# Patient Record
Sex: Female | Born: 1965
Health system: Southern US, Community
[De-identification: ages and names within clinical notes are randomized; demographics above are authoritative.]

## PROBLEM LIST (undated history)

## (undated) DIAGNOSIS — A749 Chlamydial infection, unspecified: Secondary | ICD-10-CM

## (undated) DIAGNOSIS — D649 Anemia, unspecified: Secondary | ICD-10-CM

## (undated) DIAGNOSIS — T4145XA Adverse effect of unspecified anesthetic, initial encounter: Secondary | ICD-10-CM

## (undated) DIAGNOSIS — R87619 Unspecified abnormal cytological findings in specimens from cervix uteri: Secondary | ICD-10-CM

## (undated) DIAGNOSIS — A549 Gonococcal infection, unspecified: Secondary | ICD-10-CM

## (undated) DIAGNOSIS — T8859XA Other complications of anesthesia, initial encounter: Secondary | ICD-10-CM

## (undated) DIAGNOSIS — IMO0002 Reserved for concepts with insufficient information to code with codable children: Secondary | ICD-10-CM

## (undated) DIAGNOSIS — O0900 Supervision of pregnancy with history of infertility, unspecified trimester: Secondary | ICD-10-CM

## (undated) HISTORY — DX: Reserved for concepts with insufficient information to code with codable children: IMO0002

## (undated) HISTORY — DX: Gonococcal infection, unspecified: A54.9

## (undated) HISTORY — DX: Anemia, unspecified: D64.9

## (undated) HISTORY — DX: Unspecified abnormal cytological findings in specimens from cervix uteri: R87.619

## (undated) HISTORY — DX: Adverse effect of unspecified anesthetic, initial encounter: T41.45XA

## (undated) HISTORY — DX: Chlamydial infection, unspecified: A74.9

## (undated) HISTORY — DX: Supervision of pregnancy with history of infertility, unspecified trimester: O09.00

## (undated) HISTORY — DX: Other complications of anesthesia, initial encounter: T88.59XA

---

## 1991-09-08 HISTORY — PX: KNEE SURGERY: SHX244

## 1996-09-07 DIAGNOSIS — A549 Gonococcal infection, unspecified: Secondary | ICD-10-CM

## 1996-09-07 DIAGNOSIS — A749 Chlamydial infection, unspecified: Secondary | ICD-10-CM

## 1996-09-07 HISTORY — DX: Chlamydial infection, unspecified: A74.9

## 1996-09-07 HISTORY — DX: Gonococcal infection, unspecified: A54.9

## 1998-01-29 ENCOUNTER — Other Ambulatory Visit: Admission: RE | Admit: 1998-01-29 | Discharge: 1998-01-29 | Payer: Self-pay | Admitting: Obstetrics and Gynecology

## 1998-02-04 ENCOUNTER — Other Ambulatory Visit: Admission: RE | Admit: 1998-02-04 | Discharge: 1998-02-04 | Payer: Self-pay | Admitting: Obstetrics and Gynecology

## 1999-03-27 ENCOUNTER — Other Ambulatory Visit: Admission: RE | Admit: 1999-03-27 | Discharge: 1999-03-27 | Payer: Self-pay | Admitting: *Deleted

## 1999-08-21 ENCOUNTER — Other Ambulatory Visit: Admission: RE | Admit: 1999-08-21 | Discharge: 1999-08-21 | Payer: Self-pay | Admitting: *Deleted

## 1999-09-10 ENCOUNTER — Emergency Department (HOSPITAL_COMMUNITY): Admission: EM | Admit: 1999-09-10 | Discharge: 1999-09-10 | Payer: Self-pay | Admitting: Emergency Medicine

## 2000-04-05 ENCOUNTER — Other Ambulatory Visit: Admission: RE | Admit: 2000-04-05 | Discharge: 2000-04-05 | Payer: Self-pay | Admitting: *Deleted

## 2000-04-30 ENCOUNTER — Encounter: Payer: Self-pay | Admitting: *Deleted

## 2000-04-30 ENCOUNTER — Ambulatory Visit (HOSPITAL_COMMUNITY): Admission: RE | Admit: 2000-04-30 | Discharge: 2000-04-30 | Payer: Self-pay | Admitting: *Deleted

## 2001-09-13 ENCOUNTER — Other Ambulatory Visit: Admission: RE | Admit: 2001-09-13 | Discharge: 2001-09-13 | Payer: Self-pay | Admitting: Obstetrics and Gynecology

## 2002-12-18 ENCOUNTER — Other Ambulatory Visit: Admission: RE | Admit: 2002-12-18 | Discharge: 2002-12-18 | Payer: Self-pay | Admitting: Obstetrics and Gynecology

## 2003-02-20 ENCOUNTER — Observation Stay (HOSPITAL_COMMUNITY): Admission: RE | Admit: 2003-02-20 | Discharge: 2003-02-21 | Payer: Self-pay | Admitting: Obstetrics and Gynecology

## 2004-01-08 ENCOUNTER — Other Ambulatory Visit: Admission: RE | Admit: 2004-01-08 | Discharge: 2004-01-08 | Payer: Self-pay | Admitting: Gynecology

## 2005-01-02 ENCOUNTER — Other Ambulatory Visit: Admission: RE | Admit: 2005-01-02 | Discharge: 2005-01-02 | Payer: Self-pay | Admitting: Gynecology

## 2005-12-16 ENCOUNTER — Other Ambulatory Visit: Admission: RE | Admit: 2005-12-16 | Discharge: 2005-12-16 | Payer: Self-pay | Admitting: Obstetrics and Gynecology

## 2006-02-19 ENCOUNTER — Inpatient Hospital Stay (HOSPITAL_COMMUNITY): Admission: AD | Admit: 2006-02-19 | Discharge: 2006-02-19 | Payer: Self-pay | Admitting: Obstetrics and Gynecology

## 2006-05-17 ENCOUNTER — Inpatient Hospital Stay (HOSPITAL_COMMUNITY): Admission: AD | Admit: 2006-05-17 | Discharge: 2006-05-17 | Payer: Self-pay | Admitting: Obstetrics and Gynecology

## 2006-06-08 ENCOUNTER — Ambulatory Visit: Payer: Self-pay | Admitting: Obstetrics and Gynecology

## 2006-06-17 ENCOUNTER — Inpatient Hospital Stay (HOSPITAL_COMMUNITY): Admission: AD | Admit: 2006-06-17 | Discharge: 2006-06-17 | Payer: Self-pay | Admitting: Obstetrics and Gynecology

## 2006-06-23 ENCOUNTER — Inpatient Hospital Stay (HOSPITAL_COMMUNITY): Admission: AD | Admit: 2006-06-23 | Discharge: 2006-06-26 | Payer: Self-pay | Admitting: Obstetrics and Gynecology

## 2011-03-08 ENCOUNTER — Inpatient Hospital Stay (HOSPITAL_COMMUNITY): Payer: BC Managed Care – PPO

## 2011-03-08 ENCOUNTER — Inpatient Hospital Stay (HOSPITAL_COMMUNITY)
Admission: AD | Admit: 2011-03-08 | Discharge: 2011-03-08 | Disposition: A | Discharge status: Home / Self Care | Payer: BC Managed Care – PPO | Source: Ambulatory Visit | Attending: Obstetrics and Gynecology | Admitting: Obstetrics and Gynecology

## 2011-03-08 DIAGNOSIS — O200 Threatened abortion: Secondary | ICD-10-CM | POA: Insufficient documentation

## 2011-03-08 DIAGNOSIS — O09529 Supervision of elderly multigravida, unspecified trimester: Secondary | ICD-10-CM | POA: Insufficient documentation

## 2011-03-08 LAB — URINALYSIS, ROUTINE W REFLEX MICROSCOPIC
Bilirubin Urine: NEGATIVE
Glucose, UA: NEGATIVE mg/dL
Ketones, ur: NEGATIVE mg/dL
Leukocytes, UA: NEGATIVE
Nitrite: NEGATIVE
Protein, ur: NEGATIVE mg/dL
Specific Gravity, Urine: 1.02 (ref 1.005–1.030)
Urobilinogen, UA: 0.2 mg/dL (ref 0.0–1.0)
pH: 6 (ref 5.0–8.0)

## 2011-03-08 LAB — URINE MICROSCOPIC-ADD ON

## 2012-01-08 ENCOUNTER — Telehealth: Payer: Self-pay | Admitting: Obstetrics and Gynecology

## 2012-01-08 NOTE — Telephone Encounter (Signed)
TC to pt. States is having flare up of hemorrhoids.  Is not currently pregnant.   Advised f/u with PCP.  Pt verbalizes comprehension.

## 2012-01-08 NOTE — Telephone Encounter (Signed)
Routed to triage 

## 2012-02-29 ENCOUNTER — Encounter: Payer: Self-pay | Admitting: Obstetrics and Gynecology

## 2012-03-01 ENCOUNTER — Encounter: Payer: Self-pay | Admitting: Obstetrics and Gynecology

## 2012-03-07 ENCOUNTER — Encounter: Payer: Self-pay | Admitting: Obstetrics and Gynecology

## 2012-03-07 ENCOUNTER — Ambulatory Visit (INDEPENDENT_AMBULATORY_CARE_PROVIDER_SITE_OTHER): Payer: BC Managed Care – PPO | Admitting: Obstetrics and Gynecology

## 2012-03-07 VITALS — BP 110/72 | Resp 14 | Ht 65.0 in | Wt 166.0 lb

## 2012-03-07 DIAGNOSIS — IMO0001 Reserved for inherently not codable concepts without codable children: Secondary | ICD-10-CM

## 2012-03-07 DIAGNOSIS — Z8742 Personal history of other diseases of the female genital tract: Secondary | ICD-10-CM | POA: Insufficient documentation

## 2012-03-07 DIAGNOSIS — Z309 Encounter for contraceptive management, unspecified: Secondary | ICD-10-CM

## 2012-03-07 DIAGNOSIS — Z01419 Encounter for gynecological examination (general) (routine) without abnormal findings: Secondary | ICD-10-CM

## 2012-03-07 DIAGNOSIS — E663 Overweight: Secondary | ICD-10-CM

## 2012-03-07 DIAGNOSIS — R197 Diarrhea, unspecified: Secondary | ICD-10-CM

## 2012-03-07 MED ORDER — NORETHINDRONE-ETH ESTRADIOL 1-35 MG-MCG PO TABS: 1.0000 | ORAL_TABLET | Freq: Every day | ORAL | Status: DC

## 2012-03-07 NOTE — Progress Notes (Signed)
Regular Periods: yes Mammogram: Last December 2012 "WNL"  Monthly Breast Ex.: no Exercise: yes  Tetanus < 10 years: yes Seatbelts: yes  NI. Bladder Functn.: yes Abuse at home: no  Daily BM's: yes  Stressful Work: no  Healthy Diet: yes Sigmoid-Colonoscopy: N/A  Calcium: no Medical problems this year: C/O: loose Soft stools all the time   LAST PAP:01/04/2009 pap due today  Contraception: Necon 1/35  Mammogram:  08/14/2011 WNL  PCP: Vickki Hearing  PMH: No changes  FMH: No Changes  Last Bone Scan: WNL: 2011  ANNUAL GYNECOLOGIC EXAMINATION   Carrie Mckinney is a 46 y.o. female, [id number], who presents for an annual exam. See above. She complains of soft and sometimes watery bowel movements for 3 months.  She recalls being treated for sinus infection in February of 2013.  Other family members have had a stomach virus in the last 3-4 weeks.  The patient has a history of infertility.  She has a history of a miscarriage.  She had cryotherapy for an abnormal Pap smear 20 years ago.  Prior Hysterectomy: No    History   Social History  . Marital Status: Married    Spouse Name: N/A    Number of Children: N/A  . Years of Education: N/A   Social History Main Topics  . Smoking status: Never Smoker   . Smokeless tobacco: Never Used  . Alcohol Use: No  . Drug Use: No  . Sexually Active: Yes    Birth Control/ Protection: Pill   Other Topics Concern  . None   Social History Narrative  . None    Menstrual cycle:   LMP: Patient's last menstrual period was 02/23/2012.           Cycle: Regular, monthly with normal flow and no severe dysmenorrha  The following portions of the patient's history were reviewed and updated as appropriate: allergies, current medications, past family history, past medical history, past social history, past surgical history and problem list.  Review of Systems Pertinent items are noted in HPI. Breast:Negative for breast lump,nipple discharge or nipple  retraction Gastrointestinal: Negative for abdominal pain, change in bowel habits or rectal bleeding Urinary:negative   Objective:    BP 110/72  Resp 14  Ht 5\' 5"  (1.651 m)  Wt 166 lb (75.297 kg)  BMI 27.62 kg/m2  LMP 02/23/2012    Weight:  Wt Readings from Last 1 Encounters:  03/07/12 166 lb (75.297 kg)          BMI: Body mass index is 27.62 kg/(m^2).  General Appearance: Alert, appropriate appearance for age. No acute distress HEENT: Grossly normal Neck / Thyroid: Supple, no masses, nodes or enlargement Lungs: clear to auscultation bilaterally Back: No CVA tenderness Breast Exam: No masses or nodes.No dimpling, nipple retraction or discharge. Cardiovascular: Regular rate and rhythm. S1, S2, no murmur Gastrointestinal: Soft, non-tender, no masses or organomegaly  ++++++++++++++++++++++++++++++++++++++++++++++++++++++++  Pelvic Exam: External genitalia: normal general appearance Vaginal: normal without tenderness, induration or masses and relaxation: Yes Cervix: normal appearance Adnexa: normal bimanual exam Uterus: normal size, shape, and consistency Rectovaginal: normal rectal, no masses  ++++++++++++++++++++++++++++++++++++++++++++++++++++++++  Lymphatic Exam: Non-palpable nodes in neck, clavicular, axillary, or inguinal regions Neurologic: Normal speech, no tremor  Psychiatric: Alert and oriented, appropriate affect.   Wet Prep:   not applicable Urinalysis:  not applicable UPT:           Not done   Assessment:    Normal gyn exam   Overweight or obese: Yes  Pelvic relaxation: Yes  History of infertility.  History of soft and sometimes watery bowel movements (rule out pseudomembranous colitis)   Plan:    mammogram pap smear return annually or prn Contraception:Necon 1/35    Medications prescribed: Necon 1/35  Obtain a stool sample for C. Difficile toxin  STD screen request: none  RPR: No.   HBsAg: No.  Hepatitis C: No.  The updated Pap  smear screening guidelines were discussed with the patient. The patient requested that I obtain a Pap smear: Yes.  Kegel exercises discussed: Yes.  Proper diet and regular exercise were reviewed.  Annual mammograms recommended starting at age 85. Proper breast care was discussed.  Screening colonoscopy is recommended beginning at age 29.  Regular health maintenance was reviewed.  Sleep hygiene was discussed.  Adequate calcium and vitamin D intake was emphasized.  Mylinda Latina.D.

## 2012-03-08 LAB — PAP IG W/ RFLX HPV ASCU

## 2012-03-08 NOTE — Addendum Note (Signed)
Addended by: Stephens Shire on: 03/08/2012 03:00 PM   Modules accepted: Orders

## 2012-03-15 LAB — CLOSTRIDIUM DIFFICILE EIA: CDIFTX: NEGATIVE

## 2012-03-16 ENCOUNTER — Telehealth: Payer: Self-pay | Admitting: Obstetrics and Gynecology

## 2012-03-16 NOTE — Telephone Encounter (Signed)
Triage/elect. Lab res.

## 2012-03-17 ENCOUNTER — Telehealth: Payer: Self-pay

## 2012-03-17 NOTE — Telephone Encounter (Signed)
TC TO PT REGARDING TEST RESULTS. INFORMED PT THAT HER STOOL TEST THAT SHE REQUESTED IS WNL. PT STATES THAT SHE WILL GO TO HER PCP FOR FURTHER EVAL. INFORMED PT THAT HER PAP WAS WNL ALSO. PT VOICED UNDERSTANDING.

## 2012-03-17 NOTE — Telephone Encounter (Signed)
TRIAGE/RES

## 2012-08-18 ENCOUNTER — Encounter: Payer: Self-pay | Admitting: Obstetrics and Gynecology

## 2013-07-24 ENCOUNTER — Other Ambulatory Visit: Payer: Self-pay | Admitting: Family Medicine

## 2013-07-24 DIAGNOSIS — R079 Chest pain, unspecified: Secondary | ICD-10-CM

## 2013-07-25 ENCOUNTER — Ambulatory Visit
Admission: RE | Admit: 2013-07-25 | Discharge: 2013-07-25 | Disposition: A | Discharge status: Home / Self Care | Payer: BC Managed Care – PPO | Source: Ambulatory Visit | Attending: Family Medicine | Admitting: Family Medicine

## 2013-07-25 DIAGNOSIS — R079 Chest pain, unspecified: Secondary | ICD-10-CM

## 2013-07-25 MED ORDER — IOHEXOL 300 MG/ML  SOLN
75.0000 mL | Freq: Once | INTRAMUSCULAR | Status: AC | PRN
  Administered 2013-07-25: 75 mL via INTRAVENOUS

## 2014-07-09 ENCOUNTER — Encounter: Payer: Self-pay | Admitting: Obstetrics and Gynecology

## 2016-01-31 ENCOUNTER — Institutional Professional Consult (permissible substitution): Payer: Self-pay | Admitting: Internal Medicine

## 2016-04-02 DIAGNOSIS — Z124 Encounter for screening for malignant neoplasm of cervix: Secondary | ICD-10-CM | POA: Diagnosis not present

## 2016-04-02 DIAGNOSIS — Z6830 Body mass index (BMI) 30.0-30.9, adult: Secondary | ICD-10-CM | POA: Diagnosis not present

## 2016-04-02 DIAGNOSIS — Z01419 Encounter for gynecological examination (general) (routine) without abnormal findings: Secondary | ICD-10-CM | POA: Diagnosis not present

## 2016-04-06 ENCOUNTER — Other Ambulatory Visit: Payer: Self-pay

## 2016-04-06 DIAGNOSIS — E7800 Pure hypercholesterolemia, unspecified: Secondary | ICD-10-CM | POA: Insufficient documentation

## 2016-04-06 DIAGNOSIS — K219 Gastro-esophageal reflux disease without esophagitis: Secondary | ICD-10-CM | POA: Insufficient documentation

## 2016-04-06 DIAGNOSIS — Z9109 Other allergy status, other than to drugs and biological substances: Secondary | ICD-10-CM

## 2016-04-06 DIAGNOSIS — M2240 Chondromalacia patellae, unspecified knee: Secondary | ICD-10-CM | POA: Insufficient documentation

## 2016-04-07 ENCOUNTER — Encounter (INDEPENDENT_AMBULATORY_CARE_PROVIDER_SITE_OTHER): Payer: Self-pay

## 2016-04-07 ENCOUNTER — Encounter: Payer: Self-pay | Admitting: Pulmonary Disease

## 2016-04-07 ENCOUNTER — Ambulatory Visit (INDEPENDENT_AMBULATORY_CARE_PROVIDER_SITE_OTHER): Payer: BLUE CROSS/BLUE SHIELD | Admitting: Pulmonary Disease

## 2016-04-07 ENCOUNTER — Ambulatory Visit (INDEPENDENT_AMBULATORY_CARE_PROVIDER_SITE_OTHER)
Admission: RE | Admit: 2016-04-07 | Discharge: 2016-04-07 | Disposition: A | Discharge status: Home / Self Care | Payer: BLUE CROSS/BLUE SHIELD | Source: Ambulatory Visit | Attending: Pulmonary Disease | Admitting: Pulmonary Disease

## 2016-04-07 VITALS — BP 104/68 | HR 63 | Ht 65.0 in | Wt 178.0 lb

## 2016-04-07 DIAGNOSIS — J45909 Unspecified asthma, uncomplicated: Secondary | ICD-10-CM | POA: Insufficient documentation

## 2016-04-07 DIAGNOSIS — R05 Cough: Secondary | ICD-10-CM | POA: Diagnosis not present

## 2016-04-07 DIAGNOSIS — J4520 Mild intermittent asthma, uncomplicated: Secondary | ICD-10-CM

## 2016-04-07 DIAGNOSIS — J45998 Other asthma: Secondary | ICD-10-CM | POA: Diagnosis not present

## 2016-04-07 DIAGNOSIS — IMO0002 Reserved for concepts with insufficient information to code with codable children: Secondary | ICD-10-CM

## 2016-04-07 DIAGNOSIS — R053 Chronic cough: Secondary | ICD-10-CM

## 2016-04-07 LAB — NITRIC OXIDE: NITRIC OXIDE: 17

## 2016-04-07 NOTE — Progress Notes (Signed)
Subjective:    Patient ID: Carrie Mckinney, female    DOB: 10/06/1965, 50 y.o.   MRN: [id number]   Patient is a 50 year old female never smoker  with a remote history of asthma sent by [facility] for pulmonary referal for worsening of wheezing and cough over the last few months. She does have seasonal allergies.  HPI   Pt. Presents to the office for consultation for increased wheezing and cough over the last few months.She had an upper respiratory infection in 2015 and since has noticed increased wheezing and cough. Cough is productive for yellow to dark mucus.It is worse with seasonal allergies and humidity.She was diagnosed by her PCP with asthma 1.5 years ago. She was using Singulair and Symbicort and did experience relief. She did not continue taking it, and then restarted taking it Spring of 2017. She is currently using her Singulair and Symbicort. She is now able to swim and exercise without shortness of breath. She does have a rescue inhaler but does not use it often due to it making her very jittery.She does have a remote history of reflux, but no significant difference between day and night time worsening of cough. 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 Triggers are humidity, seasonal allergies, and pool chlorine.She denies fever, purulent secretions, chest pain orthopnea or hemoptysis. She also has a remote history of back upper  pain which was worked up with CT and was diagnosed as chest wall inflammation.  Tests: Spirometry done 01/2016: No reactive airways. FEV1 83% predicted. FENO 04/07/2016>> 17  Current Outpatient Prescriptions on File Prior to Visit  Medication Sig  . albuterol (PROVENTIL HFA;VENTOLIN HFA) 108 (90 Base) MCG/ACT inhaler Inhale 1-2 puffs into the lungs every 6 (six) hours as needed for wheezing or shortness of breath.  . budesonide-formoterol (SYMBICORT) 160-4.5 MCG/ACT inhaler  Inhale 2 puffs into the lungs 2 (two) times daily.   . fexofenadine (ALLEGRA) 180 MG tablet Take 1 tablet (180 mg total) by mouth daily.  . Glucosamine 500 MG CAPS Take 1 capsule (500 mg total) by mouth daily.  . Multiple Vitamins-Minerals (MULTIVITAMIN) tablet Take 1 tablet by mouth daily.  . Omega-3 Fatty Acids (FISH OIL) 1000 MG CAPS Take 1 capsule (1,000 mg total) by mouth daily.   No current facility-administered medications on file prior to visit.     Allergies  Allergen Reactions  . Erythromycin   . Mucinex [Guaifenesin Er]   . Zithromax [Azithromycin]   . Zofran [Ondansetron Hcl]   . Fluzone [Flu Virus Vaccine]     INTRADERMAL   . Guaifenesin & Derivatives     Family History  Problem Relation Age of Onset  . Autism Other   . Hypertension Mother   . Depression Mother   . Heart attack Maternal Uncle   . Bipolar disorder Paternal Aunt   . Heart attack Maternal Grandmother   . Hypertension Maternal Grandmother   . ALS Maternal Grandfather   . Lupus Paternal Grandmother   . Alzheimer's disease Paternal Grandfather   . Multiple sclerosis Cousin     Social History   Social History Narrative  . No narrative on file    Review of Systems  Constitutional: Negative for fever and unexpected weight change.  HENT: Negative for congestion, dental problem, ear pain, nosebleeds, postnasal drip, rhinorrhea, sinus pressure, sneezing, sore throat and trouble swallowing.  Seasonal allergies - controlled well with Singulair  Eyes: Negative for redness and itching.  Respiratory: Positive for cough, chest tightness, shortness of breath and wheezing.   Cardiovascular: Negative for palpitations and leg swelling.  Gastrointestinal: Negative for nausea and vomiting.  Genitourinary: Negative for dysuria.  Musculoskeletal: Negative for joint swelling.  Skin: Negative for rash.  Neurological: Negative for headaches.  Hematological: Does not bruise/bleed easily.    Psychiatric/Behavioral: Negative for dysphoric mood. The patient is not nervous/anxious.        Objective:   Physical Exam  General- No distress,  A&Ox3, pleasant ENT: No sinus tenderness, TM clear, pale nasal mucosa, no oral exudate,no post nasal drip, no LAN Cardiac: S1, S2, regular rate and rhythm, no murmur Chest: No wheeze/ rales/ dullness; no accessory muscle use, no nasal flaring, no sternal retractions Abd.: Soft Non-tender Ext: No clubbing cyanosis, edema Neuro:  normal strength Skin: No rashes, warm and dry Psych: normal mood and behavior        Assessment & Plan:   Asthma with Cough Plan: We will do FENO test today to check for airway inflammation. Continue taking your your Singulair Daily as you have been doing. Continue your Allegra as needed for seasonal allergies. Continue using your Symbicort 2 puffs once  Daily, increase to twice daily as needed for worsening shortness of breath.. We will give you a spacer and teach you how to use it Rinse your mouth with water, or brush your teeth after use. Take zantac 150 mg every night before bed. We will give you a copy of the GERD diet. Follow up with Dr. Craige Cotta in 3 months Flu shot this fall. Pneumovax at 50 years old. Please contact office for sooner follow up if symptoms do not improve or worsen or seek emergency care   Attending note: She had asthma as a child.  She had a respiratory infection about 1.5 yrs ago, and has a persistent cough with sputum ever since.  She notices trouble with various exposures.  She has a cat, but no issues around cat.  She had hives from zithromax.  Denies aspirin sensitivity.  Outpt spirometry was normal.  Pulmonary tests Spirometry 01/08/16 >> FEV1 2.42 (83%), FEV1% 79 FeNO 04/07/16 >> 17  No sinus tenderness, no oral exudate, no LAN, no wheeze, HR regular, abdomen soft, no rashes, no edema.  Assessment/plan: Asthma. - continue symbicort, singulair, prn albuterol - chest xray  today - will have her use spacer device with symbicort  Possible reflux contributing to cough. - with have her try OTC H2 blocker   Coralyn Helling, MD [facility] Pulmonary/Critical Care 04/07/2016, 4:20 PM Pager:  [phone]

## 2016-04-07 NOTE — Patient Instructions (Addendum)
It is nice to meet you today. We will do FENO test today to check for airway inflammation. Continue taking your your Singulair Daily as you have been doing. Continue your Allegra as needed for seasonal allergies. Continue using your Symbicort 2 puffs once  Daily, increase to twice daily as needed for worsening shortness of breath.. We will give you a spacer and teach you how to use it Rinse your mouth with water, or brush your teeth after use. Take zantac 150 mg every night before bed. We will give you a copy of the GERD diet. Follow up with Dr. Craige Cotta in 3 months Flu shot this fall. Pneumovax at 50 years old. Please contact office for sooner follow up if symptoms do not improve or worsen or seek emergency care

## 2016-04-07 NOTE — Assessment & Plan Note (Addendum)
We will do FENO test today to check for airway inflammation. We will have a chest x ray done today and we will call you with results. Continue taking your your Singulair Daily as you have been doing. Continue your Allegra as needed for seasonal allergies. Continue using your Symbicort 2 puffs once  Daily, increase to twice daily as needed for worsening shortness of breath.. We will give you a spacer and teach you how to use it Rinse your mouth with water, or brush your teeth after use. Take zantac 150 mg every night before bed. We will give you a copy of the GERD diet. Follow up with Dr. Craige Cotta in 3 months Flu shot this fall. Pneumovax at 50 years old. Please contact office for sooner follow up if symptoms do not improve or worsen or seek emergency care

## 2016-04-08 ENCOUNTER — Telehealth: Payer: Self-pay | Admitting: Pulmonary Disease

## 2016-04-08 NOTE — Telephone Encounter (Signed)
LM x 1 

## 2016-04-08 NOTE — Telephone Encounter (Signed)
Dg Chest 2 View  Result Date: 04/08/2016 CLINICAL DATA:  Asthma with cough EXAM: CHEST  2 VIEW COMPARISON:  Chest radiograph October 13, 2011; chest CT July 25, 2013 FINDINGS: There is no edema or consolidation. The heart size and pulmonary vascularity are normal. No adenopathy. No bone lesions. IMPRESSION: No edema or consolidation. Electronically Signed   By: Bretta Bang III M.D.   On: 04/08/2016 08:04    Will have my nurse inform pt that CXR was normal.

## 2016-04-08 NOTE — Telephone Encounter (Signed)
Spoke with pt,aware of recs.  Nothing further needed.  

## 2016-04-27 ENCOUNTER — Encounter: Payer: Self-pay | Admitting: Pulmonary Disease

## 2016-07-13 ENCOUNTER — Encounter: Payer: Self-pay | Admitting: Pulmonary Disease

## 2016-07-13 ENCOUNTER — Ambulatory Visit (INDEPENDENT_AMBULATORY_CARE_PROVIDER_SITE_OTHER): Payer: BLUE CROSS/BLUE SHIELD | Admitting: Pulmonary Disease

## 2016-07-13 VITALS — BP 108/76 | HR 76 | Ht 65.0 in | Wt 177.2 lb

## 2016-07-13 DIAGNOSIS — J4530 Mild persistent asthma, uncomplicated: Secondary | ICD-10-CM

## 2016-07-13 DIAGNOSIS — Z23 Encounter for immunization: Secondary | ICD-10-CM | POA: Diagnosis not present

## 2016-07-13 NOTE — Progress Notes (Signed)
Current Outpatient Prescriptions on File Prior to Visit  Medication Sig  . albuterol (PROVENTIL HFA;VENTOLIN HFA) 108 (90 Base) MCG/ACT inhaler Inhale 1-2 puffs into the lungs every 6 (six) hours as needed for wheezing or shortness of breath.  . budesonide-formoterol (SYMBICORT) 160-4.5 MCG/ACT inhaler Inhale 2 puffs into the lungs 2 (two) times daily.   . Glucosamine 500 MG CAPS Take 1 capsule (500 mg total) by mouth daily.  . Multiple Vitamins-Minerals (MULTIVITAMIN) tablet Take 1 tablet by mouth daily.  . Omega-3 Fatty Acids (FISH OIL) 1000 MG CAPS Take 1 capsule (1,000 mg total) by mouth daily.  . fexofenadine (ALLEGRA) 180 MG tablet Take 1 tablet (180 mg total) by mouth daily.   No current facility-administered medications on file prior to visit.      Chief Complaint  Patient presents with  . Follow-up    Pt. states her cough is doing better, less phelgm, Pt. states the tightness in her chest has gone away, flu shot today     Pulmonary tests Spirometry 01/08/16 >> FEV1 2.42 (83%), FEV1% 79 FeNO 04/07/16 >> 17  Past medical history GC, Chlamydia  Past surgical history, Family history, Social history, Allergies all reviewed.  Vital Signs BP 108/76 (BP Location: Left Arm, Patient Position: Sitting, Cuff Size: Normal)   Pulse 76   Ht 5\' 5"  (1.651 m)   Wt 177 lb 3.2 oz (80.4 kg)   SpO2 98%   BMI 29.49 kg/m   History of Present Illness Carrie Mckinney is a 50 y.o. female with cough 2nd to allergic asthma.  She is feeling better.  Still has some cough with sputum, but much improved.  No longer having chest tightness.  Denies sinus congestion or post nasal drip.  Only using symbicort 2 puffs daily.  Still using singulair nightly.  Not using allegra at present.  Not needing albuterol.  Reports her symptoms really only happen during Spring or when she has a respiratory infection.  She had previous allergy testing >> told she was allergic to everything but dogs and  medications.   Physical Exam  General - No distress ENT - No sinus tenderness, no oral exudate, no LAN Cardiac - s1s2 regular, no murmur Chest - No wheeze/rales/dullness Back - No focal tenderness Abd - Soft, non-tender Ext - No edema Neuro - Normal strength Skin - No rashes Psych - normal mood, and behavior   Assessment/Plan  Allergic asthma. - she can try reducing symbicort - if she does okay off symbicort, then she can try stopping singulair - will re-assess her symptom status in the Spring - continue prn albuterol - flu shot today   Patient Instructions  Can try weaning off symbicort, and if this goes okay can then try stopping singulair  Flu shot today  Follow up in 6 months    Coralyn HellingVineet Jolynne Spurgin, MD [facility] Pulmonary/Critical Care/Sleep Pager:  [phone] 07/13/2016, 12:05 PM

## 2016-07-13 NOTE — Patient Instructions (Signed)
Can try weaning off symbicort, and if this goes okay can then try stopping singulair  Flu shot today  Follow up in 6 months

## 2016-09-28 DIAGNOSIS — Z1231 Encounter for screening mammogram for malignant neoplasm of breast: Secondary | ICD-10-CM | POA: Diagnosis not present

## 2017-02-16 DIAGNOSIS — R87610 Atypical squamous cells of undetermined significance on cytologic smear of cervix (ASC-US): Secondary | ICD-10-CM | POA: Diagnosis not present

## 2017-02-16 DIAGNOSIS — Z6830 Body mass index (BMI) 30.0-30.9, adult: Secondary | ICD-10-CM | POA: Diagnosis not present

## 2017-02-16 DIAGNOSIS — Z124 Encounter for screening for malignant neoplasm of cervix: Secondary | ICD-10-CM | POA: Diagnosis not present

## 2017-02-16 DIAGNOSIS — Z01411 Encounter for gynecological examination (general) (routine) with abnormal findings: Secondary | ICD-10-CM | POA: Diagnosis not present

## 2017-06-16 DIAGNOSIS — M25542 Pain in joints of left hand: Secondary | ICD-10-CM | POA: Diagnosis not present

## 2017-06-16 DIAGNOSIS — E7800 Pure hypercholesterolemia, unspecified: Secondary | ICD-10-CM | POA: Diagnosis not present

## 2017-06-16 DIAGNOSIS — Z0000 Encounter for general adult medical examination without abnormal findings: Secondary | ICD-10-CM | POA: Diagnosis not present

## 2017-06-16 DIAGNOSIS — Z23 Encounter for immunization: Secondary | ICD-10-CM | POA: Diagnosis not present

## 2017-06-16 DIAGNOSIS — M25541 Pain in joints of right hand: Secondary | ICD-10-CM | POA: Diagnosis not present

## 2017-07-01 DIAGNOSIS — M25541 Pain in joints of right hand: Secondary | ICD-10-CM | POA: Diagnosis not present

## 2017-07-01 DIAGNOSIS — E7800 Pure hypercholesterolemia, unspecified: Secondary | ICD-10-CM | POA: Diagnosis not present

## 2017-09-21 DIAGNOSIS — K5901 Slow transit constipation: Secondary | ICD-10-CM | POA: Diagnosis not present

## 2017-09-21 DIAGNOSIS — K219 Gastro-esophageal reflux disease without esophagitis: Secondary | ICD-10-CM | POA: Diagnosis not present

## 2017-09-21 DIAGNOSIS — Z1211 Encounter for screening for malignant neoplasm of colon: Secondary | ICD-10-CM | POA: Diagnosis not present

## 2017-10-06 ENCOUNTER — Encounter (INDEPENDENT_AMBULATORY_CARE_PROVIDER_SITE_OTHER): Payer: Self-pay | Admitting: Orthopedic Surgery

## 2017-10-06 ENCOUNTER — Ambulatory Visit (INDEPENDENT_AMBULATORY_CARE_PROVIDER_SITE_OTHER): Payer: BLUE CROSS/BLUE SHIELD | Admitting: Orthopedic Surgery

## 2017-10-06 ENCOUNTER — Ambulatory Visit (INDEPENDENT_AMBULATORY_CARE_PROVIDER_SITE_OTHER): Payer: BLUE CROSS/BLUE SHIELD

## 2017-10-06 VITALS — Ht 65.0 in | Wt 177.0 lb

## 2017-10-06 DIAGNOSIS — M6701 Short Achilles tendon (acquired), right ankle: Secondary | ICD-10-CM

## 2017-10-06 DIAGNOSIS — M79671 Pain in right foot: Secondary | ICD-10-CM | POA: Diagnosis not present

## 2017-10-06 DIAGNOSIS — M7741 Metatarsalgia, right foot: Secondary | ICD-10-CM

## 2017-10-06 NOTE — Progress Notes (Signed)
Office Visit Note   Patient: Carrie Mckinney           Date of Birth: Jan 22, 1966           MRN: [id number] Visit Date: 10/06/2017              Requested by: Carrie Blamer, MD [phone] [name] [place], [place] [address] PCP: Carrie Blamer, MD  Chief Complaint  Patient presents with  . Right Foot - Pain      HPI: Patient is a 52 year old woman who presents with a 3-week history of pain beneath the right foot second metatarsal head.  Patient states she has had some burning pain at the end of the day but primarily feels a mass beneath the second metatarsal head.  Patient states that her mother has rheumatoid arthritis and her grandmother has lupus she was concerned about a connective tissue disease.  She states that her rheumatoid factor was negative her ANA was positive but she does not know the level.  Assessment & Plan: Visit Diagnoses:  1. Pain in right foot   2. Metatarsalgia, right foot   3. Achilles tendon contracture, right     Plan: Patient will get a pair of stiff soled Trail running shoes to protect the metatarsal head over-the-counter orthotics and she was given instructions for heel cord stretching and this was demonstrated to her.  Discussed that if she fails conservative treatment with these 3 options a Weil osteotomy is a surgical option.  Follow-Up Instructions: Return if symptoms worsen or fail to improve.   Ortho Exam  Patient is alert, oriented, no adenopathy, well-dressed, normal affect, normal respiratory effort. Examination patient has a normal gait.  She has dorsiflexion to neutral with her knee extended on the right she has a good dorsalis pedis and posterior tibial pulse.  She is point tender to palpation over the second metatarsal head with callus beneath this area the remainder of the web spaces and metatarsal heads are nontender to palpation no evidence of a Morton's neuroma.  Radiographs showed no bone mass or lytic changes no  fractures.  Imaging: Xr Foot 2 Views Right  Result Date: 10/06/2017 2 view radiographs of the right foot shows a long second metatarsal.  There are no destructive bony changes no cystic changes no joint space narrowing.  No images are attached to the encounter.  Labs: No results found for: HGBA1C, ESRSEDRATE, CRP, LABURIC, REPTSTATUS, GRAMSTAIN, CULT, LABORGA  @LABSALLVALUES (HGBA1)@  Body mass index is 29.45 kg/m.  Orders:  Orders Placed This Encounter  Procedures  . XR Foot 2 Views Right   No orders of the defined types were placed in this encounter.    Procedures: No procedures performed  Clinical Data: No additional findings.  ROS:  All other systems negative, except as noted in the HPI. Review of Systems  Objective: Vital Signs: Ht 5\' 5"  (1.651 m)   Wt 177 lb (80.3 kg)   BMI 29.45 kg/m   Specialty Comments:  No specialty comments available.  PMFS History: Patient Active Problem List   Diagnosis Date Noted  . Asthma 04/07/2016  . Hypercholesteremia 04/06/2016  . Environmental allergies 04/06/2016  . Chondromalacia, patella 04/06/2016  . GERD (gastroesophageal reflux disease) 04/06/2016  . Overweight(278.02) 03/07/2012  . History of infertility 03/07/2012   Past Medical History:  Diagnosis Date  . Abnormal Pap smear   . Anemia    history  . Chlamydia 1998   treated  . Clomid pregnancy   .  Complication of anesthesia    bp drops  . Gonorrhea 1998   treated  . Newborn product of IVF pregnancy 2006-2007   2 times    Family History  Problem Relation Age of Onset  . Autism Other   . Hypertension Mother   . Depression Mother   . Heart attack Maternal Uncle   . Bipolar disorder Paternal Aunt   . Heart attack Maternal Grandmother   . Hypertension Maternal Grandmother   . ALS Maternal Grandfather   . Lupus Paternal Grandmother   . Alzheimer's disease Paternal Grandfather   . Multiple sclerosis Cousin     Past Surgical History:  Procedure  Laterality Date  . KNEE SURGERY  1993   right knee   Social History   Occupational History  . Occupation: Psychologist, educationalHome School Educator  Tobacco Use  . Smoking status: Never Smoker  . Smokeless tobacco: Never Used  Substance and Sexual Activity  . Alcohol use: Yes    Comment: occ glass of wine with dinner  . Drug use: No  . Sexual activity: Yes    Birth control/protection: Pill

## 2017-10-20 ENCOUNTER — Ambulatory Visit (INDEPENDENT_AMBULATORY_CARE_PROVIDER_SITE_OTHER): Payer: BLUE CROSS/BLUE SHIELD | Admitting: Orthopedic Surgery

## 2017-10-22 DIAGNOSIS — J029 Acute pharyngitis, unspecified: Secondary | ICD-10-CM | POA: Diagnosis not present

## 2017-10-22 DIAGNOSIS — J209 Acute bronchitis, unspecified: Secondary | ICD-10-CM | POA: Diagnosis not present

## 2017-10-28 ENCOUNTER — Ambulatory Visit
Admission: RE | Admit: 2017-10-28 | Discharge: 2017-10-28 | Disposition: A | Discharge status: Home / Self Care | Payer: BLUE CROSS/BLUE SHIELD | Source: Ambulatory Visit | Attending: Family Medicine | Admitting: Family Medicine

## 2017-10-28 ENCOUNTER — Other Ambulatory Visit: Payer: Self-pay | Admitting: Family Medicine

## 2017-10-28 DIAGNOSIS — J4521 Mild intermittent asthma with (acute) exacerbation: Secondary | ICD-10-CM

## 2017-10-28 DIAGNOSIS — J45901 Unspecified asthma with (acute) exacerbation: Secondary | ICD-10-CM | POA: Diagnosis not present

## 2017-11-15 DIAGNOSIS — Z1231 Encounter for screening mammogram for malignant neoplasm of breast: Secondary | ICD-10-CM | POA: Diagnosis not present

## 2017-11-26 DIAGNOSIS — Z1211 Encounter for screening for malignant neoplasm of colon: Secondary | ICD-10-CM | POA: Diagnosis not present

## 2017-11-26 DIAGNOSIS — D125 Benign neoplasm of sigmoid colon: Secondary | ICD-10-CM | POA: Diagnosis not present

## 2017-11-26 DIAGNOSIS — K635 Polyp of colon: Secondary | ICD-10-CM | POA: Diagnosis not present

## 2018-01-03 DIAGNOSIS — J029 Acute pharyngitis, unspecified: Secondary | ICD-10-CM | POA: Diagnosis not present

## 2018-01-04 DIAGNOSIS — B9689 Other specified bacterial agents as the cause of diseases classified elsewhere: Secondary | ICD-10-CM | POA: Diagnosis not present

## 2018-01-04 DIAGNOSIS — J038 Acute tonsillitis due to other specified organisms: Secondary | ICD-10-CM | POA: Diagnosis not present

## 2018-03-02 DIAGNOSIS — R102 Pelvic and perineal pain: Secondary | ICD-10-CM | POA: Diagnosis not present

## 2018-03-02 DIAGNOSIS — Z6830 Body mass index (BMI) 30.0-30.9, adult: Secondary | ICD-10-CM | POA: Diagnosis not present

## 2018-03-02 DIAGNOSIS — Z124 Encounter for screening for malignant neoplasm of cervix: Secondary | ICD-10-CM | POA: Diagnosis not present

## 2018-03-02 DIAGNOSIS — R87618 Other abnormal cytological findings on specimens from cervix uteri: Secondary | ICD-10-CM | POA: Diagnosis not present

## 2018-03-02 DIAGNOSIS — Z01411 Encounter for gynecological examination (general) (routine) with abnormal findings: Secondary | ICD-10-CM | POA: Diagnosis not present

## 2018-03-28 DIAGNOSIS — R768 Other specified abnormal immunological findings in serum: Secondary | ICD-10-CM | POA: Diagnosis not present

## 2018-03-28 DIAGNOSIS — Z8709 Personal history of other diseases of the respiratory system: Secondary | ICD-10-CM | POA: Diagnosis not present

## 2018-03-28 DIAGNOSIS — M2550 Pain in unspecified joint: Secondary | ICD-10-CM | POA: Diagnosis not present

## 2018-04-21 DIAGNOSIS — R2689 Other abnormalities of gait and mobility: Secondary | ICD-10-CM | POA: Diagnosis not present

## 2018-04-21 DIAGNOSIS — R42 Dizziness and giddiness: Secondary | ICD-10-CM | POA: Diagnosis not present

## 2018-04-21 DIAGNOSIS — R51 Headache: Secondary | ICD-10-CM | POA: Diagnosis not present

## 2018-04-22 ENCOUNTER — Other Ambulatory Visit: Payer: Self-pay | Admitting: Family Medicine

## 2018-04-22 DIAGNOSIS — G4452 New daily persistent headache (NDPH): Secondary | ICD-10-CM

## 2018-05-01 ENCOUNTER — Ambulatory Visit
Admission: RE | Admit: 2018-05-01 | Discharge: 2018-05-01 | Disposition: A | Discharge status: Home / Self Care | Payer: BLUE CROSS/BLUE SHIELD | Source: Ambulatory Visit | Attending: Family Medicine | Admitting: Family Medicine

## 2018-05-01 DIAGNOSIS — R42 Dizziness and giddiness: Secondary | ICD-10-CM | POA: Diagnosis not present

## 2018-05-01 DIAGNOSIS — G4452 New daily persistent headache (NDPH): Secondary | ICD-10-CM

## 2018-07-01 ENCOUNTER — Ambulatory Visit: Payer: BLUE CROSS/BLUE SHIELD | Admitting: Diagnostic Neuroimaging

## 2018-07-04 DIAGNOSIS — M2550 Pain in unspecified joint: Secondary | ICD-10-CM | POA: Diagnosis not present

## 2018-07-04 DIAGNOSIS — R768 Other specified abnormal immunological findings in serum: Secondary | ICD-10-CM | POA: Diagnosis not present

## 2018-07-08 DIAGNOSIS — Z23 Encounter for immunization: Secondary | ICD-10-CM | POA: Diagnosis not present

## 2018-09-09 IMAGING — MR MR HEAD W/O CM
10 series · 48 of 48 positions shown · non-contrast
Comparison: None.

CLINICAL DATA: Headaches vertigo. New persistent daily headache.
Abnormal balance. Vertigo

EXAM:
MRI HEAD WITHOUT CONTRAST
TECHNIQUE: Multiplanar, multiecho pulse sequences of the brain and surrounding
structures were obtained without intravenous contrast.

[Series 2: t1_se_sag · sagittal · 5.0mm · 0.45mm/px · 3 of 25 slices shown]
[im 1/25]
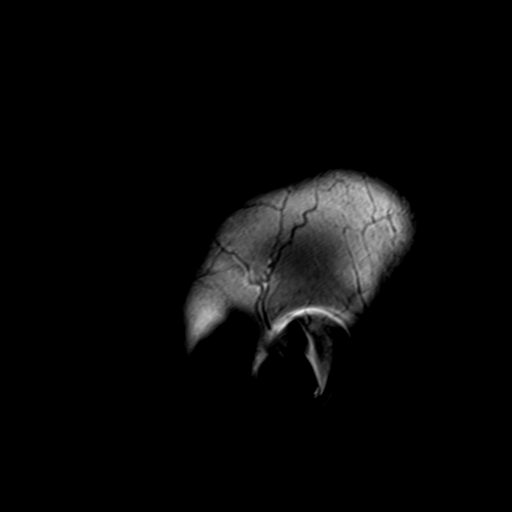
[im 13/25]
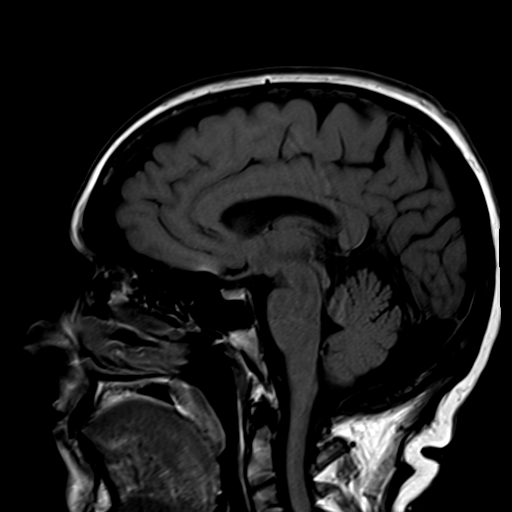
[im 25/25]
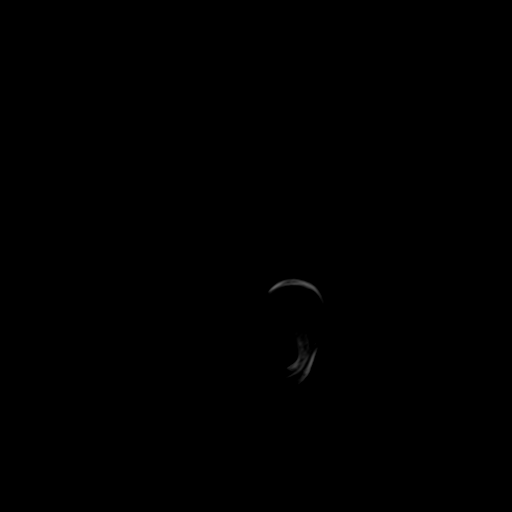

[Series 3: ep2d_diff_(id)_trace · axial · 3.0mm · 1.88mm/px · z∈[-86,+76]mm · 9 of 109 slices shown]
[im 1/109]
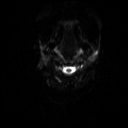
[im 14/109]
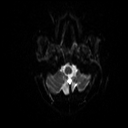
[im 28/109]
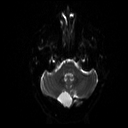
[im 41/109]
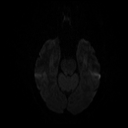
[im 55/109]
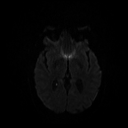
[im 68/109]
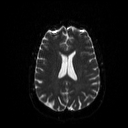
[im 82/109]
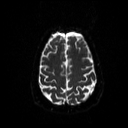
[im 95/109]
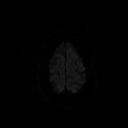
[im 109/109]
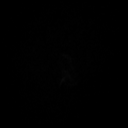

[Series 4: ep2d_diff_(id)_trace_adc · axial · 3.0mm · 1.88mm/px · z∈[-86,+76]mm · 4 of 55 slices shown]
[im 1/55]
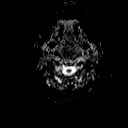
[im 19/55]
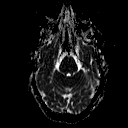
[im 37/55]
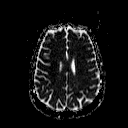
[im 55/55]
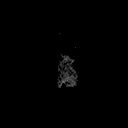

[Series 5: ep2d_diff_cor · coronal · 5.0mm · 1.85mm/px · 5 of 59 slices shown]
[im 1/59]
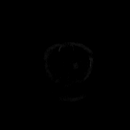
[im 15/59]
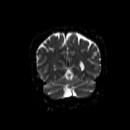
[im 30/59]
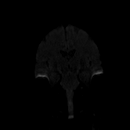
[im 44/59]
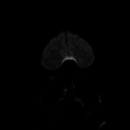
[im 59/59]
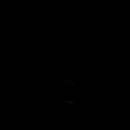

[Series 6: ep2d_diff_cor_adc · coronal · 5.0mm · 1.85mm/px · 2 of 30 slices shown]
[im 1/30]
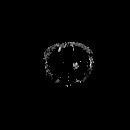
[im 30/30]
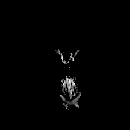

[Series 8: swi_images · axial · 2.0mm · 0.90mm/px · z∈[-78,+80]mm · 6 of 80 slices shown]
[im 1/80]
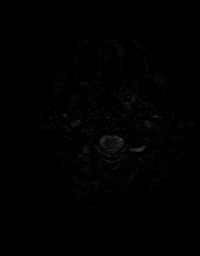
[im 16/80]
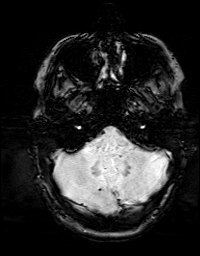
[im 32/80]
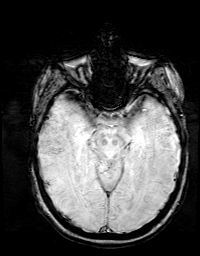
[im 48/80]
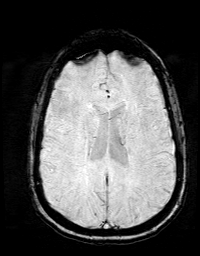
[im 64/80]
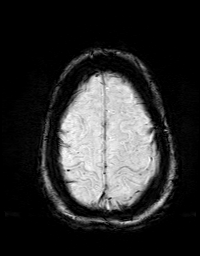
[im 80/80]
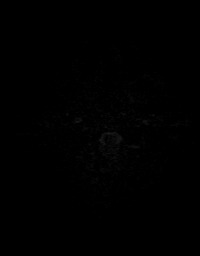

[Series 9: FLAIR · axial · 3.0mm · 0.43mm/px · z∈[-78,+78]mm · 2 of 27 slices shown]
[im 1/27]
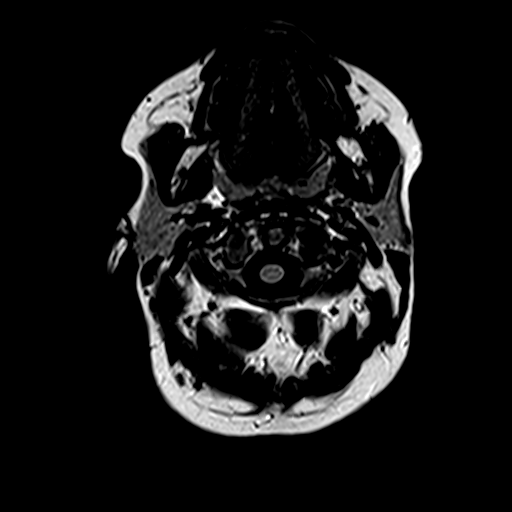
[im 27/27]
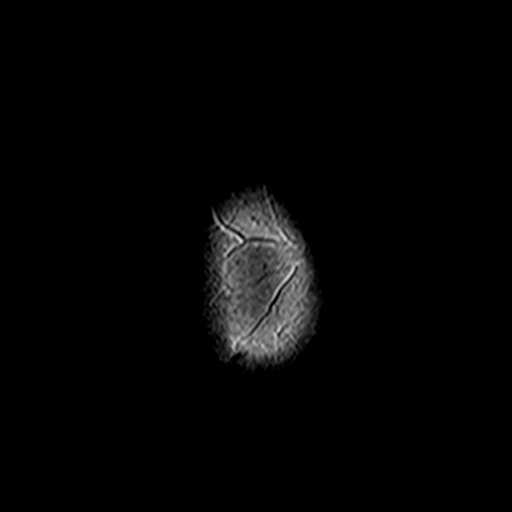

[Series 10: t2_tse_tra_512 · axial · 5.0mm · 0.62mm/px · z∈[-88,+74]mm · 2 of 28 slices shown]
[im 1/28]
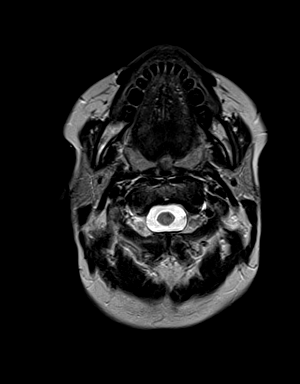
[im 28/28]
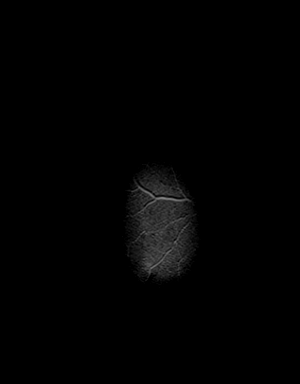

[Series 11: t1_mpr_tra · axial · 1.0mm · 0.72mm/px · z∈[-78,+81]mm · 13 of 160 slices shown]
[im 1/160]
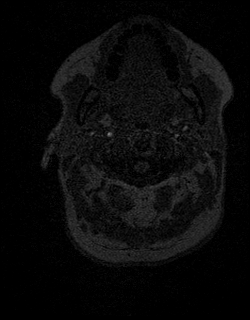
[im 14/160]
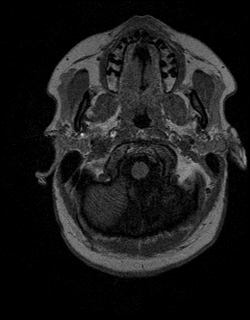
[im 27/160]
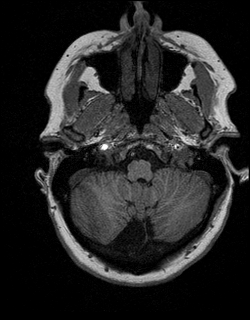
[im 40/160]
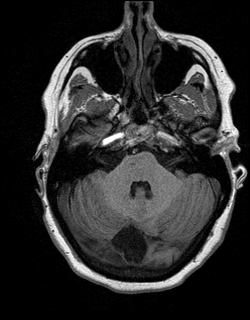
[im 54/160]
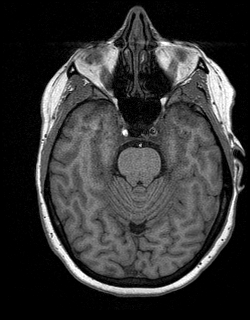
[im 67/160]
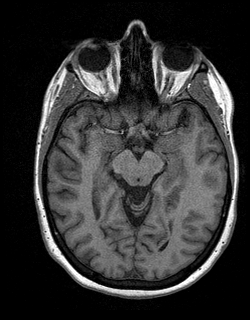
[im 80/160]
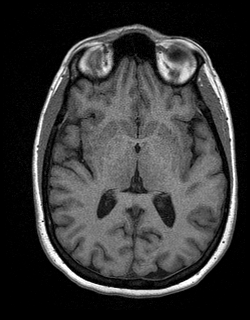
[im 93/160]
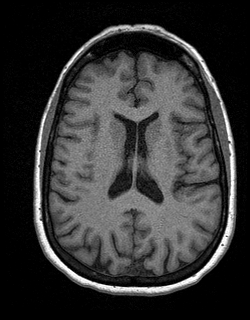
[im 107/160]
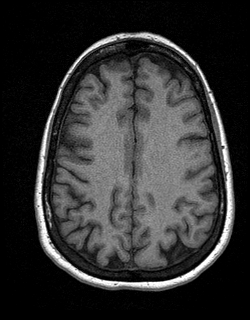
[im 120/160]
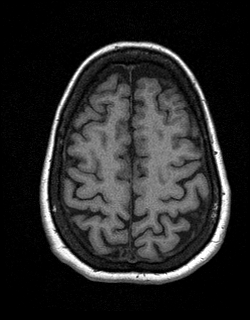
[im 133/160]
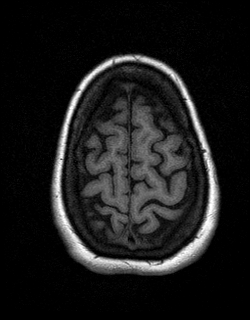
[im 146/160]
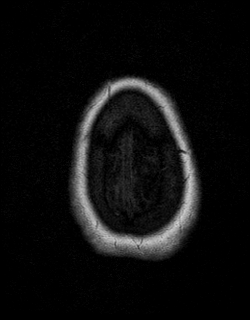
[im 160/160]
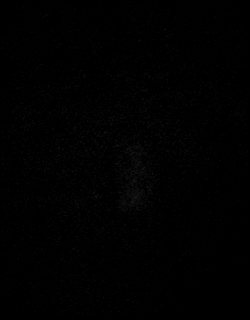

[Series 12: T2 · coronal · 5.0mm · 0.45mm/px · 2 of 31 slices shown]
[im 1/31]
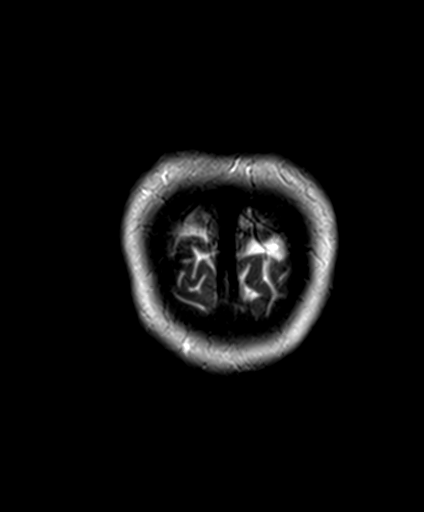
[im 31/31]
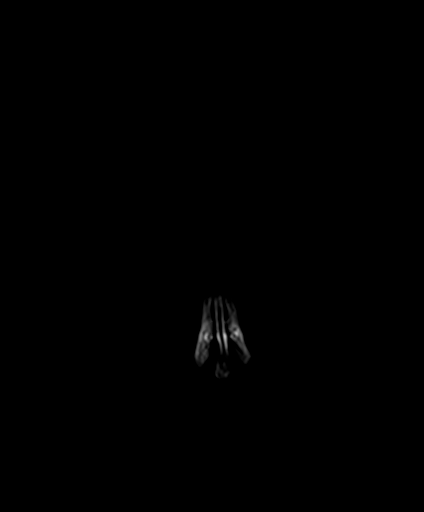

[48 of 48 positions shown; findings below may reference images not displayed]

FINDINGS: Brain: No acute infarct, hemorrhage, or mass lesion is present.
Arachnoid cyst the right posterior fossa measures up to 2.7 x 2 4 cm
maximally in axial dimension. Fourth ventricle and basal cisterns
are within. There is no evidence of significant mass effect. No
white matter changes are present. Internal auditory canals are
normal. Brainstem and cerebellum otherwise within. Flow is present
in the major intracranial arteries.

Vascular: Flow is present in the major intracranial arteries.

Skull and upper cervical spine: The skull base is within normal
limits. Craniocervical junction is normal. Upper cervical spine is
within normal limits.

Sinuses/Orbits: The paranasal sinuses and mastoid air cells are
clear. Globes and orbits are within normal limits.
IMPRESSION: 1. Normal MRI the brain age.
2. Benign-appearing and posterior fossa arachnoid cyst is likely
longstanding and unlikely to be of consequence to the patient.

## 2018-11-07 DIAGNOSIS — Z1231 Encounter for screening mammogram for malignant neoplasm of breast: Secondary | ICD-10-CM | POA: Diagnosis not present

## 2019-03-07 DIAGNOSIS — Z6829 Body mass index (BMI) 29.0-29.9, adult: Secondary | ICD-10-CM | POA: Diagnosis not present

## 2019-03-07 DIAGNOSIS — Z01419 Encounter for gynecological examination (general) (routine) without abnormal findings: Secondary | ICD-10-CM | POA: Diagnosis not present

## 2019-03-07 DIAGNOSIS — N9489 Other specified conditions associated with female genital organs and menstrual cycle: Secondary | ICD-10-CM | POA: Diagnosis not present

## 2019-04-22 ENCOUNTER — Encounter (HOSPITAL_COMMUNITY): Payer: Self-pay | Admitting: Emergency Medicine

## 2019-04-22 ENCOUNTER — Emergency Department (HOSPITAL_COMMUNITY)
Admission: EM | Admit: 2019-04-22 | Discharge: 2019-04-22 | Disposition: A | Discharge status: Home / Self Care | Payer: BC Managed Care – PPO | Attending: Emergency Medicine | Admitting: Emergency Medicine

## 2019-04-22 ENCOUNTER — Other Ambulatory Visit: Payer: Self-pay

## 2019-04-22 DIAGNOSIS — W260XXA Contact with knife, initial encounter: Secondary | ICD-10-CM | POA: Insufficient documentation

## 2019-04-22 DIAGNOSIS — Y999 Unspecified external cause status: Secondary | ICD-10-CM | POA: Insufficient documentation

## 2019-04-22 DIAGNOSIS — Y92000 Kitchen of unspecified non-institutional (private) residence as  the place of occurrence of the external cause: Secondary | ICD-10-CM | POA: Diagnosis not present

## 2019-04-22 DIAGNOSIS — S61211A Laceration without foreign body of left index finger without damage to nail, initial encounter: Secondary | ICD-10-CM | POA: Insufficient documentation

## 2019-04-22 DIAGNOSIS — Y93G1 Activity, food preparation and clean up: Secondary | ICD-10-CM | POA: Insufficient documentation

## 2019-04-22 NOTE — Discharge Instructions (Addendum)
You were evaluated in the Emergency Department and after careful evaluation, we did not find any emergent condition requiring admission or further testing in the hospital.  Your symptoms today seem to be due to a laceration of the finger, which we repaired here in the emergency department with medical adhesive.  As discussed, please avoid submerging the wound in water and try to keep it dry for the next week.  The glue will eventually wear away as your wound heals.  Please return to the Emergency Department if you experience any worsening of your condition.  We encourage you to follow up with a primary care provider.  Thank you for allowing [place] to be a part of your care.

## 2019-04-22 NOTE — ED Provider Notes (Signed)
[facility] Emergency Department Provider Note MRN:  [id number]  Arrival date & time: 04/22/19     Chief Complaint   Laceration   History of Present Illness   Carrie Mckinney is a 53 y.o. year-old female with no pertinent past medical history presenting to the ED with chief complaint of laceration.  Patient was in the kitchen cutting her basil when she accidentally slipped with her new pair of kitchen scissors and sustained a laceration to her left index finger.  No other injuries, pain is mild, constant, described as a burning pain.  Review of Systems  A problem-focused ROS was performed. Positive for finger laceration.  Patient denies head trauma.  Patient's Health History    Past Medical History:  Diagnosis Date  . Abnormal Pap smear   . Anemia    history  . Chlamydia 1998   treated  . Clomid pregnancy   . Complication of anesthesia    bp drops  . Gonorrhea 1998   treated  . Newborn product of IVF pregnancy 2006-2007   2 times    Past Surgical History:  Procedure Laterality Date  . KNEE SURGERY  1993   right knee    Family History  Problem Relation Age of Onset  . Autism Other   . Hypertension Mother   . Depression Mother   . Heart attack Maternal Uncle   . Bipolar disorder Paternal Aunt   . Heart attack Maternal Grandmother   . Hypertension Maternal Grandmother   . ALS Maternal Grandfather   . Lupus Paternal Grandmother   . Alzheimer's disease Paternal Grandfather   . Multiple sclerosis Cousin     Social History   Socioeconomic History  . Marital status: Married    Spouse name: Not on file  . Number of children: Not on file  . Years of education: Not on file  . Highest education level: Not on file  Occupational History  . Occupation: Psychologist, educationalHome School Educator  Social Needs  . Financial resource strain: Not on file  . Food insecurity    Worry: Not on file    Inability: Not on file  . Transportation needs    Medical: Not on file     Non-medical: Not on file  Tobacco Use  . Smoking status: Never Smoker  . Smokeless tobacco: Never Used  Substance and Sexual Activity  . Alcohol use: Yes    Comment: occ glass of wine with dinner  . Drug use: No  . Sexual activity: Yes    Birth control/protection: Pill  Lifestyle  . Physical activity    Days per week: Not on file    Minutes per session: Not on file  . Stress: Not on file  Relationships  . Social Musicianconnections    Talks on phone: Not on file    Gets together: Not on file    Attends religious service: Not on file    Active member of club or organization: Not on file    Attends meetings of clubs or organizations: Not on file    Relationship status: Not on file  . Intimate partner violence    Fear of current or ex partner: Not on file    Emotionally abused: Not on file    Physically abused: Not on file    Forced sexual activity: Not on file  Other Topics Concern  . Not on file  Social History Narrative  . Not on file     Physical Exam  Vital  Signs and Nursing Notes reviewed Vitals:   04/22/19 1958  BP: (!) 143/82  Pulse: 64  Resp: 15  Temp: 98.1 F (36.7 C)  SpO2: 99%    CONSTITUTIONAL: Well-appearing, NAD NEURO:  Alert and oriented x 3, no focal deficits EYES:  eyes equal and reactive ENT/NECK:  no LAD, no JVD CARDIO: Regular rate, well-perfused, normal S1 and S2 PULM:  CTAB no wheezing or rhonchi GI/GU:  normal bowel sounds, non-distended, non-tender MSK/SPINE:  No gross deformities, no edema SKIN:  no rash; small flap laceration to the left index finger on the volar aspect superficial to the PIP PSYCH:  Appropriate speech and behavior  Diagnostic and Interventional Summary    Labs Reviewed - No data to display  No orders to display    Medications - No data to display   .Marland KitchenLaceration Repair  Date/Time: 04/22/2019 9:20 PM Performed by: Maudie Flakes, MD Authorized by: Maudie Flakes, MD   Consent:    Consent obtained:  Verbal    Consent given by:  Patient   Risks discussed:  Infection, pain and poor cosmetic result   Alternatives discussed:  No treatment Anesthesia (see MAR for exact dosages):    Anesthesia method:  None Laceration details:    Location:  Finger   Finger location:  L index finger   Length (cm):  2   Depth (mm):  1 Repair type:    Repair type:  Simple Treatment:    Area cleansed with:  Saline   Amount of cleaning:  Standard Skin repair:    Repair method:  Tissue adhesive Approximation:    Approximation:  Close Post-procedure details:    Dressing:  Open (no dressing)   Patient tolerance of procedure:  Tolerated well, no immediate complications   Critical Care  ED Course and Medical Decision Making  I have reviewed the triage vital signs and the nursing notes.  Pertinent labs & imaging results that were available during my care of the patient were reviewed by me and considered in my medical decision making (see below for details).  Uncomplicated finger laceration repaired as described above.  After the discussed management above, the patient was determined to be safe for discharge.  The patient was in agreement with this plan and all questions regarding their care were answered.  ED return precautions were discussed and the patient will return to the ED with any significant worsening of condition.  Barth Kirks. Sedonia Small, [place] [email or web address]  Final Clinical Impressions(s) / ED Diagnoses     ICD-10-CM   1. Laceration of left index finger without foreign body without damage to nail, initial encounter  X83.382N     ED Discharge Orders    None         Maudie Flakes, MD 04/22/19 2120

## 2019-04-22 NOTE — ED Triage Notes (Signed)
Patient reports cutting left index finger with scissors while cooking dinner. Laceration to finger. Bleeding controlled. Unknown last tetanus.

## 2019-04-24 ENCOUNTER — Ambulatory Visit: Payer: Self-pay

## 2019-04-24 ENCOUNTER — Encounter: Payer: Self-pay | Admitting: Orthopedic Surgery

## 2019-04-24 ENCOUNTER — Ambulatory Visit (INDEPENDENT_AMBULATORY_CARE_PROVIDER_SITE_OTHER): Payer: BC Managed Care – PPO | Admitting: Orthopedic Surgery

## 2019-04-24 VITALS — Ht 65.0 in | Wt 176.2 lb

## 2019-04-24 DIAGNOSIS — M6702 Short Achilles tendon (acquired), left ankle: Secondary | ICD-10-CM

## 2019-04-24 DIAGNOSIS — M79672 Pain in left foot: Secondary | ICD-10-CM | POA: Diagnosis not present

## 2019-04-26 ENCOUNTER — Encounter: Payer: Self-pay | Admitting: Orthopedic Surgery

## 2019-04-26 NOTE — Progress Notes (Signed)
Office Visit Note   Patient: Carrie Mckinney           Date of Birth: 06/19/1966           MRN: [id number] Visit Pollyann KennedyDate: 04/24/2019              Requested by: Johny BlamerHarris, William, MD [phone] [place],  [place] [id number] PCP: Johny BlamerHarris, William, MD  Chief Complaint  Patient presents with  . Left Foot - Pain      HPI: Patient is a 53 year old woman who presents complaining of left foot pain she states she feels pain transversely across the forefoot.  She is worried she may have a stress fracture she has been having symptoms for 2 months.  She complains of throbbing with weightbearing with a pain level 4-5 over 10.  She is not taking any medication for this she complains of pain with shoe wear.  Assessment & Plan: Visit Diagnoses:  1. Left foot pain   2. Achilles tendon contracture, left     Plan: Recommended Achilles stretching and this was demonstrated to her to do a minute at a time 5 times a day.  Recommended stiff soled sneakers to further unload the forefoot.  Recommended vitamin D3 2000 international units a day for bone health.  Follow-Up Instructions: Return if symptoms worsen or fail to improve.   Ortho Exam  Patient is alert, oriented, no adenopathy, well-dressed, normal affect, normal respiratory effort. Examination patient has a good pulse she is tender to palpation across the forefoot.  The Achilles tendon is nontender to palpation.  With her knee extended she has 20 degrees of dorsiflexion.  She is tender primarily over the entire fifth metatarsal.  Radiographs shows no fractures and no callus formation.  Imaging: No results found. No images are attached to the encounter.  Labs: No results found for: HGBA1C, ESRSEDRATE, CRP, LABURIC, REPTSTATUS, GRAMSTAIN, CULT, LABORGA   No results found for: ALBUMIN, PREALBUMIN, LABURIC  No results found for: MG No results found for: VD25OH  No results found for: PREALBUMIN No flowsheet data found.   Body  mass index is 29.32 kg/m.  Orders:  Orders Placed This Encounter  Procedures  . XR Foot 2 Views Left   No orders of the defined types were placed in this encounter.    Procedures: No procedures performed  Clinical Data: No additional findings.  ROS:  All other systems negative, except as noted in the HPI. Review of Systems  Objective: Vital Signs: Ht 5\' 5"  (1.651 m)   Wt 176 lb 3.2 oz (79.9 kg)   BMI 29.32 kg/m   Specialty Comments:  No specialty comments available.  PMFS History: Patient Active Problem List   Diagnosis Date Noted  . Asthma 04/07/2016  . Hypercholesteremia 04/06/2016  . Environmental allergies 04/06/2016  . Chondromalacia, patella 04/06/2016  . GERD (gastroesophageal reflux disease) 04/06/2016  . Overweight(278.02) 03/07/2012  . History of infertility 03/07/2012   Past Medical History:  Diagnosis Date  . Abnormal Pap smear   . Anemia    history  . Chlamydia 1998   treated  . Clomid pregnancy   . Complication of anesthesia    bp drops  . Gonorrhea 1998   treated  . Newborn product of IVF pregnancy 2006-2007   2 times    Family History  Problem Relation Age of Onset  . Autism Other   . Hypertension Mother   . Depression Mother   . Heart attack Maternal Uncle   .  Bipolar disorder Paternal Aunt   . Heart attack Maternal Grandmother   . Hypertension Maternal Grandmother   . ALS Maternal Grandfather   . Lupus Paternal Grandmother   . Alzheimer's disease Paternal Grandfather   . Multiple sclerosis Cousin     Past Surgical History:  Procedure Laterality Date  . KNEE SURGERY  1993   right knee   Social History   Occupational History  . Occupation: Systems developer  Tobacco Use  . Smoking status: Never Smoker  . Smokeless tobacco: Never Used  Substance and Sexual Activity  . Alcohol use: Yes    Comment: occ glass of wine with dinner  . Drug use: No  . Sexual activity: Yes    Birth control/protection: Pill

## 2019-05-31 DIAGNOSIS — Z23 Encounter for immunization: Secondary | ICD-10-CM | POA: Diagnosis not present

## 2019-07-28 DIAGNOSIS — N939 Abnormal uterine and vaginal bleeding, unspecified: Secondary | ICD-10-CM | POA: Diagnosis not present

## 2019-07-28 DIAGNOSIS — N920 Excessive and frequent menstruation with regular cycle: Secondary | ICD-10-CM | POA: Diagnosis not present

## 2019-08-09 ENCOUNTER — Other Ambulatory Visit: Payer: Self-pay | Admitting: Obstetrics and Gynecology

## 2019-08-09 DIAGNOSIS — N920 Excessive and frequent menstruation with regular cycle: Secondary | ICD-10-CM | POA: Diagnosis not present

## 2019-08-09 DIAGNOSIS — N840 Polyp of corpus uteri: Secondary | ICD-10-CM | POA: Diagnosis not present

## 2019-08-09 DIAGNOSIS — Z01419 Encounter for gynecological examination (general) (routine) without abnormal findings: Secondary | ICD-10-CM | POA: Diagnosis not present

## 2019-08-09 DIAGNOSIS — N939 Abnormal uterine and vaginal bleeding, unspecified: Secondary | ICD-10-CM | POA: Diagnosis not present

## 2019-09-13 DIAGNOSIS — E559 Vitamin D deficiency, unspecified: Secondary | ICD-10-CM | POA: Diagnosis not present

## 2019-09-13 DIAGNOSIS — Z0000 Encounter for general adult medical examination without abnormal findings: Secondary | ICD-10-CM | POA: Diagnosis not present

## 2019-09-13 DIAGNOSIS — E7800 Pure hypercholesterolemia, unspecified: Secondary | ICD-10-CM | POA: Diagnosis not present

## 2019-11-28 DIAGNOSIS — Z1239 Encounter for other screening for malignant neoplasm of breast: Secondary | ICD-10-CM | POA: Diagnosis not present

## 2019-11-28 DIAGNOSIS — Z09 Encounter for follow-up examination after completed treatment for conditions other than malignant neoplasm: Secondary | ICD-10-CM | POA: Diagnosis not present

## 2019-12-05 DIAGNOSIS — Z1231 Encounter for screening mammogram for malignant neoplasm of breast: Secondary | ICD-10-CM | POA: Diagnosis not present

## 2020-04-03 DIAGNOSIS — Z124 Encounter for screening for malignant neoplasm of cervix: Secondary | ICD-10-CM | POA: Diagnosis not present

## 2020-04-03 DIAGNOSIS — Z6831 Body mass index (BMI) 31.0-31.9, adult: Secondary | ICD-10-CM | POA: Diagnosis not present

## 2020-04-03 DIAGNOSIS — Z01419 Encounter for gynecological examination (general) (routine) without abnormal findings: Secondary | ICD-10-CM | POA: Diagnosis not present

## 2020-05-01 DIAGNOSIS — E7800 Pure hypercholesterolemia, unspecified: Secondary | ICD-10-CM | POA: Diagnosis not present

## 2020-06-11 DIAGNOSIS — D225 Melanocytic nevi of trunk: Secondary | ICD-10-CM | POA: Diagnosis not present

## 2020-06-11 DIAGNOSIS — L820 Inflamed seborrheic keratosis: Secondary | ICD-10-CM | POA: Diagnosis not present

## 2020-06-11 DIAGNOSIS — L812 Freckles: Secondary | ICD-10-CM | POA: Diagnosis not present

## 2020-06-11 DIAGNOSIS — L821 Other seborrheic keratosis: Secondary | ICD-10-CM | POA: Diagnosis not present

## 2020-07-10 DIAGNOSIS — Z23 Encounter for immunization: Secondary | ICD-10-CM | POA: Diagnosis not present

## 2020-09-18 DIAGNOSIS — R102 Pelvic and perineal pain: Secondary | ICD-10-CM | POA: Diagnosis not present

## 2020-09-18 DIAGNOSIS — N921 Excessive and frequent menstruation with irregular cycle: Secondary | ICD-10-CM | POA: Diagnosis not present

## 2020-09-18 DIAGNOSIS — Z0000 Encounter for general adult medical examination without abnormal findings: Secondary | ICD-10-CM | POA: Diagnosis not present

## 2020-10-11 DIAGNOSIS — E559 Vitamin D deficiency, unspecified: Secondary | ICD-10-CM | POA: Diagnosis not present

## 2020-10-11 DIAGNOSIS — E7800 Pure hypercholesterolemia, unspecified: Secondary | ICD-10-CM | POA: Diagnosis not present

## 2020-10-24 DIAGNOSIS — N959 Unspecified menopausal and perimenopausal disorder: Secondary | ICD-10-CM | POA: Diagnosis not present

## 2020-10-24 DIAGNOSIS — Z1239 Encounter for other screening for malignant neoplasm of breast: Secondary | ICD-10-CM | POA: Diagnosis not present

## 2020-10-24 DIAGNOSIS — N950 Postmenopausal bleeding: Secondary | ICD-10-CM | POA: Diagnosis not present

## 2020-10-24 DIAGNOSIS — Z1231 Encounter for screening mammogram for malignant neoplasm of breast: Secondary | ICD-10-CM | POA: Diagnosis not present

## 2021-02-26 DIAGNOSIS — Z01419 Encounter for gynecological examination (general) (routine) without abnormal findings: Secondary | ICD-10-CM | POA: Diagnosis not present

## 2021-02-26 DIAGNOSIS — N951 Menopausal and female climacteric states: Secondary | ICD-10-CM | POA: Diagnosis not present

## 2021-02-26 DIAGNOSIS — Z124 Encounter for screening for malignant neoplasm of cervix: Secondary | ICD-10-CM | POA: Diagnosis not present

## 2021-05-20 DIAGNOSIS — D1801 Hemangioma of skin and subcutaneous tissue: Secondary | ICD-10-CM | POA: Diagnosis not present

## 2021-05-20 DIAGNOSIS — D225 Melanocytic nevi of trunk: Secondary | ICD-10-CM | POA: Diagnosis not present

## 2021-05-20 DIAGNOSIS — L814 Other melanin hyperpigmentation: Secondary | ICD-10-CM | POA: Diagnosis not present

## 2021-05-20 DIAGNOSIS — L821 Other seborrheic keratosis: Secondary | ICD-10-CM | POA: Diagnosis not present

## 2021-06-12 DIAGNOSIS — Z23 Encounter for immunization: Secondary | ICD-10-CM | POA: Diagnosis not present

## 2021-07-30 DIAGNOSIS — J029 Acute pharyngitis, unspecified: Secondary | ICD-10-CM | POA: Diagnosis not present

## 2021-07-30 DIAGNOSIS — R5383 Other fatigue: Secondary | ICD-10-CM | POA: Diagnosis not present

## 2021-07-30 DIAGNOSIS — J069 Acute upper respiratory infection, unspecified: Secondary | ICD-10-CM | POA: Diagnosis not present

## 2021-07-30 DIAGNOSIS — Z03818 Encounter for observation for suspected exposure to other biological agents ruled out: Secondary | ICD-10-CM | POA: Diagnosis not present

## 2021-08-14 DIAGNOSIS — M5412 Radiculopathy, cervical region: Secondary | ICD-10-CM | POA: Diagnosis not present

## 2021-09-23 DIAGNOSIS — Z1231 Encounter for screening mammogram for malignant neoplasm of breast: Secondary | ICD-10-CM | POA: Diagnosis not present

## 2021-09-23 DIAGNOSIS — E7800 Pure hypercholesterolemia, unspecified: Secondary | ICD-10-CM | POA: Diagnosis not present

## 2021-09-23 DIAGNOSIS — M5412 Radiculopathy, cervical region: Secondary | ICD-10-CM | POA: Diagnosis not present

## 2021-09-23 DIAGNOSIS — E559 Vitamin D deficiency, unspecified: Secondary | ICD-10-CM | POA: Diagnosis not present

## 2021-09-23 DIAGNOSIS — Z0000 Encounter for general adult medical examination without abnormal findings: Secondary | ICD-10-CM | POA: Diagnosis not present

## 2021-12-19 DIAGNOSIS — L509 Urticaria, unspecified: Secondary | ICD-10-CM | POA: Diagnosis not present

## 2022-03-18 DIAGNOSIS — Z789 Other specified health status: Secondary | ICD-10-CM | POA: Diagnosis not present

## 2022-03-18 DIAGNOSIS — R197 Diarrhea, unspecified: Secondary | ICD-10-CM | POA: Diagnosis not present

## 2022-03-19 DIAGNOSIS — R197 Diarrhea, unspecified: Secondary | ICD-10-CM | POA: Diagnosis not present

## 2022-05-06 DIAGNOSIS — Z01419 Encounter for gynecological examination (general) (routine) without abnormal findings: Secondary | ICD-10-CM | POA: Diagnosis not present

## 2022-05-06 DIAGNOSIS — Z124 Encounter for screening for malignant neoplasm of cervix: Secondary | ICD-10-CM | POA: Diagnosis not present

## 2022-05-06 DIAGNOSIS — N951 Menopausal and female climacteric states: Secondary | ICD-10-CM | POA: Diagnosis not present

## 2022-05-06 DIAGNOSIS — Z780 Asymptomatic menopausal state: Secondary | ICD-10-CM | POA: Diagnosis not present

## 2022-05-20 DIAGNOSIS — L812 Freckles: Secondary | ICD-10-CM | POA: Diagnosis not present

## 2022-05-20 DIAGNOSIS — L821 Other seborrheic keratosis: Secondary | ICD-10-CM | POA: Diagnosis not present

## 2022-05-20 DIAGNOSIS — D2272 Melanocytic nevi of left lower limb, including hip: Secondary | ICD-10-CM | POA: Diagnosis not present

## 2022-05-20 DIAGNOSIS — D2271 Melanocytic nevi of right lower limb, including hip: Secondary | ICD-10-CM | POA: Diagnosis not present

## 2022-06-25 DIAGNOSIS — M7989 Other specified soft tissue disorders: Secondary | ICD-10-CM | POA: Diagnosis not present

## 2022-06-25 DIAGNOSIS — M1991 Primary osteoarthritis, unspecified site: Secondary | ICD-10-CM | POA: Diagnosis not present

## 2022-06-29 DIAGNOSIS — Z23 Encounter for immunization: Secondary | ICD-10-CM | POA: Diagnosis not present

## 2022-10-08 DIAGNOSIS — J392 Other diseases of pharynx: Secondary | ICD-10-CM | POA: Diagnosis not present

## 2022-10-08 DIAGNOSIS — J301 Allergic rhinitis due to pollen: Secondary | ICD-10-CM | POA: Diagnosis not present

## 2022-10-08 DIAGNOSIS — R002 Palpitations: Secondary | ICD-10-CM | POA: Diagnosis not present

## 2022-10-08 DIAGNOSIS — E7800 Pure hypercholesterolemia, unspecified: Secondary | ICD-10-CM | POA: Diagnosis not present

## 2022-10-08 DIAGNOSIS — Z0000 Encounter for general adult medical examination without abnormal findings: Secondary | ICD-10-CM | POA: Diagnosis not present

## 2022-11-06 DIAGNOSIS — Z1231 Encounter for screening mammogram for malignant neoplasm of breast: Secondary | ICD-10-CM | POA: Diagnosis not present

## 2022-11-12 DIAGNOSIS — D3131 Benign neoplasm of right choroid: Secondary | ICD-10-CM | POA: Diagnosis not present

## 2022-12-16 DIAGNOSIS — J309 Allergic rhinitis, unspecified: Secondary | ICD-10-CM | POA: Diagnosis not present

## 2022-12-16 DIAGNOSIS — R490 Dysphonia: Secondary | ICD-10-CM | POA: Diagnosis not present

## 2023-02-08 DIAGNOSIS — R490 Dysphonia: Secondary | ICD-10-CM | POA: Diagnosis not present

## 2023-02-08 DIAGNOSIS — R053 Chronic cough: Secondary | ICD-10-CM | POA: Diagnosis not present

## 2023-02-08 DIAGNOSIS — J381 Polyp of vocal cord and larynx: Secondary | ICD-10-CM | POA: Diagnosis not present

## 2023-03-03 DIAGNOSIS — J381 Polyp of vocal cord and larynx: Secondary | ICD-10-CM | POA: Diagnosis not present

## 2023-03-03 DIAGNOSIS — R490 Dysphonia: Secondary | ICD-10-CM | POA: Diagnosis not present

## 2023-03-08 DIAGNOSIS — Z01419 Encounter for gynecological examination (general) (routine) without abnormal findings: Secondary | ICD-10-CM | POA: Diagnosis not present

## 2023-03-08 DIAGNOSIS — Z79890 Hormone replacement therapy (postmenopausal): Secondary | ICD-10-CM | POA: Diagnosis not present

## 2023-03-10 DIAGNOSIS — R053 Chronic cough: Secondary | ICD-10-CM | POA: Diagnosis not present

## 2023-03-10 DIAGNOSIS — J381 Polyp of vocal cord and larynx: Secondary | ICD-10-CM | POA: Diagnosis not present

## 2023-03-10 DIAGNOSIS — R490 Dysphonia: Secondary | ICD-10-CM | POA: Diagnosis not present

## 2023-03-18 DIAGNOSIS — R490 Dysphonia: Secondary | ICD-10-CM | POA: Diagnosis not present

## 2023-03-18 DIAGNOSIS — J381 Polyp of vocal cord and larynx: Secondary | ICD-10-CM | POA: Diagnosis not present

## 2023-04-14 DIAGNOSIS — R490 Dysphonia: Secondary | ICD-10-CM | POA: Diagnosis not present

## 2023-04-14 DIAGNOSIS — R053 Chronic cough: Secondary | ICD-10-CM | POA: Diagnosis not present

## 2023-04-14 DIAGNOSIS — J381 Polyp of vocal cord and larynx: Secondary | ICD-10-CM | POA: Diagnosis not present

## 2023-05-11 DIAGNOSIS — R490 Dysphonia: Secondary | ICD-10-CM | POA: Diagnosis not present

## 2023-05-11 DIAGNOSIS — J381 Polyp of vocal cord and larynx: Secondary | ICD-10-CM | POA: Diagnosis not present

## 2023-05-11 DIAGNOSIS — R053 Chronic cough: Secondary | ICD-10-CM | POA: Diagnosis not present

## 2023-05-26 DIAGNOSIS — J381 Polyp of vocal cord and larynx: Secondary | ICD-10-CM | POA: Diagnosis not present

## 2023-05-26 DIAGNOSIS — R490 Dysphonia: Secondary | ICD-10-CM | POA: Diagnosis not present

## 2023-05-26 DIAGNOSIS — R053 Chronic cough: Secondary | ICD-10-CM | POA: Diagnosis not present

## 2023-06-14 DIAGNOSIS — R053 Chronic cough: Secondary | ICD-10-CM | POA: Diagnosis not present

## 2023-06-14 DIAGNOSIS — R490 Dysphonia: Secondary | ICD-10-CM | POA: Diagnosis not present

## 2023-06-14 DIAGNOSIS — J386 Stenosis of larynx: Secondary | ICD-10-CM | POA: Diagnosis not present

## 2023-06-14 DIAGNOSIS — J381 Polyp of vocal cord and larynx: Secondary | ICD-10-CM | POA: Diagnosis not present

## 2023-06-21 DIAGNOSIS — B820 Intestinal helminthiasis, unspecified: Secondary | ICD-10-CM | POA: Diagnosis not present

## 2023-06-21 DIAGNOSIS — G4489 Other headache syndrome: Secondary | ICD-10-CM | POA: Diagnosis not present

## 2023-06-21 DIAGNOSIS — J386 Stenosis of larynx: Secondary | ICD-10-CM | POA: Diagnosis not present

## 2023-06-23 DIAGNOSIS — D225 Melanocytic nevi of trunk: Secondary | ICD-10-CM | POA: Diagnosis not present

## 2023-06-23 DIAGNOSIS — L578 Other skin changes due to chronic exposure to nonionizing radiation: Secondary | ICD-10-CM | POA: Diagnosis not present

## 2023-06-23 DIAGNOSIS — D2272 Melanocytic nevi of left lower limb, including hip: Secondary | ICD-10-CM | POA: Diagnosis not present

## 2023-06-23 DIAGNOSIS — L821 Other seborrheic keratosis: Secondary | ICD-10-CM | POA: Diagnosis not present

## 2023-06-30 DIAGNOSIS — R768 Other specified abnormal immunological findings in serum: Secondary | ICD-10-CM | POA: Diagnosis not present

## 2023-06-30 DIAGNOSIS — J386 Stenosis of larynx: Secondary | ICD-10-CM | POA: Diagnosis not present

## 2023-06-30 DIAGNOSIS — M2559 Pain in other specified joint: Secondary | ICD-10-CM | POA: Diagnosis not present

## 2023-06-30 DIAGNOSIS — M1991 Primary osteoarthritis, unspecified site: Secondary | ICD-10-CM | POA: Diagnosis not present

## 2023-07-09 DIAGNOSIS — Z23 Encounter for immunization: Secondary | ICD-10-CM | POA: Diagnosis not present

## 2023-07-23 ENCOUNTER — Other Ambulatory Visit (HOSPITAL_COMMUNITY): Payer: Self-pay

## 2023-07-23 MED ORDER — COVID-19 MRNA VAC-TRIS(PFIZER) 30 MCG/0.3ML IM SUSY
0.3000 mL | PREFILLED_SYRINGE | INTRAMUSCULAR | 0 refills | Status: AC
  Filled 2023-07-23: qty 0.3, 1d supply, fill #0

## 2023-07-26 DIAGNOSIS — B820 Intestinal helminthiasis, unspecified: Secondary | ICD-10-CM | POA: Diagnosis not present

## 2023-08-13 DIAGNOSIS — B820 Intestinal helminthiasis, unspecified: Secondary | ICD-10-CM | POA: Diagnosis not present

## 2023-10-15 DIAGNOSIS — E7800 Pure hypercholesterolemia, unspecified: Secondary | ICD-10-CM | POA: Diagnosis not present

## 2023-10-15 DIAGNOSIS — R030 Elevated blood-pressure reading, without diagnosis of hypertension: Secondary | ICD-10-CM | POA: Diagnosis not present

## 2023-10-15 DIAGNOSIS — Z0000 Encounter for general adult medical examination without abnormal findings: Secondary | ICD-10-CM | POA: Diagnosis not present

## 2023-10-18 DIAGNOSIS — R490 Dysphonia: Secondary | ICD-10-CM | POA: Diagnosis not present

## 2023-10-18 DIAGNOSIS — J386 Stenosis of larynx: Secondary | ICD-10-CM | POA: Diagnosis not present

## 2023-10-18 DIAGNOSIS — J384 Edema of larynx: Secondary | ICD-10-CM | POA: Diagnosis not present

## 2023-10-26 DIAGNOSIS — Z1231 Encounter for screening mammogram for malignant neoplasm of breast: Secondary | ICD-10-CM | POA: Diagnosis not present

## 2023-12-09 DIAGNOSIS — D3131 Benign neoplasm of right choroid: Secondary | ICD-10-CM | POA: Diagnosis not present

## 2024-04-19 DIAGNOSIS — Z01419 Encounter for gynecological examination (general) (routine) without abnormal findings: Secondary | ICD-10-CM | POA: Diagnosis not present

## 2024-04-19 DIAGNOSIS — R232 Flushing: Secondary | ICD-10-CM | POA: Diagnosis not present

## 2024-06-09 DIAGNOSIS — M2559 Pain in other specified joint: Secondary | ICD-10-CM | POA: Diagnosis not present

## 2024-06-09 DIAGNOSIS — R7689 Other specified abnormal immunological findings in serum: Secondary | ICD-10-CM | POA: Diagnosis not present

## 2024-06-09 DIAGNOSIS — J386 Stenosis of larynx: Secondary | ICD-10-CM | POA: Diagnosis not present

## 2024-06-09 DIAGNOSIS — M1991 Primary osteoarthritis, unspecified site: Secondary | ICD-10-CM | POA: Diagnosis not present

## 2024-06-28 DIAGNOSIS — D225 Melanocytic nevi of trunk: Secondary | ICD-10-CM | POA: Diagnosis not present

## 2024-06-28 DIAGNOSIS — L821 Other seborrheic keratosis: Secondary | ICD-10-CM | POA: Diagnosis not present

## 2024-06-28 DIAGNOSIS — L812 Freckles: Secondary | ICD-10-CM | POA: Diagnosis not present

## 2024-07-13 DIAGNOSIS — Z23 Encounter for immunization: Secondary | ICD-10-CM | POA: Diagnosis not present

## 2024-07-20 DIAGNOSIS — M545 Low back pain, unspecified: Secondary | ICD-10-CM | POA: Diagnosis not present

## 2024-07-20 DIAGNOSIS — R519 Headache, unspecified: Secondary | ICD-10-CM | POA: Diagnosis not present

## 2024-09-25 ENCOUNTER — Other Ambulatory Visit (HOSPITAL_COMMUNITY): Payer: Self-pay

## 2024-09-25 MED ORDER — COMIRNATY 30 MCG/0.3ML IM SUSY
0.3000 mL | PREFILLED_SYRINGE | Freq: Once | INTRAMUSCULAR | 0 refills | Status: AC
  Filled 2024-09-25: qty 0.3, 1d supply, fill #0
